# Patient Record
Sex: Female | Born: 1980 | Hispanic: Yes | Smoking: Never smoker
Health system: Southern US, Community
[De-identification: ages and names within clinical notes are randomized; demographics above are authoritative.]

## PROBLEM LIST (undated history)

## (undated) DIAGNOSIS — E079 Disorder of thyroid, unspecified: Secondary | ICD-10-CM

## (undated) DIAGNOSIS — E119 Type 2 diabetes mellitus without complications: Secondary | ICD-10-CM

## (undated) HISTORY — PX: TUBAL LIGATION: SHX77

## (undated) HISTORY — PX: CHOLECYSTECTOMY: SHX55

---

## 2021-03-28 ENCOUNTER — Emergency Department
Admission: EM | Admit: 2021-03-28 | Discharge: 2021-03-28 | Disposition: A | Discharge status: Home / Self Care | Payer: 59 | Source: Home / Self Care | Attending: Family Medicine | Admitting: Family Medicine

## 2021-03-28 ENCOUNTER — Other Ambulatory Visit: Payer: Self-pay

## 2021-03-28 ENCOUNTER — Encounter: Payer: Self-pay | Admitting: Emergency Medicine

## 2021-03-28 DIAGNOSIS — R319 Hematuria, unspecified: Secondary | ICD-10-CM

## 2021-03-28 DIAGNOSIS — N23 Unspecified renal colic: Secondary | ICD-10-CM | POA: Diagnosis not present

## 2021-03-28 DIAGNOSIS — N3001 Acute cystitis with hematuria: Secondary | ICD-10-CM

## 2021-03-28 DIAGNOSIS — R109 Unspecified abdominal pain: Secondary | ICD-10-CM

## 2021-03-28 HISTORY — DX: Disorder of thyroid, unspecified: E07.9

## 2021-03-28 LAB — POCT URINALYSIS DIP (MANUAL ENTRY)
Bilirubin, UA: NEGATIVE
Glucose, UA: NEGATIVE mg/dL
Ketones, POC UA: NEGATIVE mg/dL
Nitrite, UA: POSITIVE — AB
Protein Ur, POC: 100 mg/dL — AB
Spec Grav, UA: >=1.03 — AB (ref 1.010–1.025)
Urobilinogen, UA: 0.2 E.U./dL
pH, UA: 5.5 (ref 5.0–8.0)

## 2021-03-28 MED ORDER — KETOROLAC TROMETHAMINE 60 MG/2ML IM SOLN
60.0000 mg | Freq: Once | INTRAMUSCULAR | Status: AC
  Administered 2021-03-28: 60 mg via INTRAMUSCULAR

## 2021-03-28 MED ORDER — TAMSULOSIN HCL 0.4 MG PO CAPS: 0.4000 mg | ORAL_CAPSULE | Freq: Every day | ORAL | 1 refills | Status: DC

## 2021-03-28 MED ORDER — HYDROCODONE-ACETAMINOPHEN 7.5-325 MG PO TABS: 1.0000 | ORAL_TABLET | Freq: Four times a day (QID) | ORAL | 0 refills | Status: DC | PRN

## 2021-03-28 MED ORDER — CIPROFLOXACIN HCL 500 MG PO TABS: 500.0000 mg | ORAL_TABLET | Freq: Two times a day (BID) | ORAL | 0 refills | Status: DC

## 2021-03-28 NOTE — ED Provider Notes (Signed)
[name]    CSN: [id number] Arrival date & time: 03/28/21  1702      History   Chief Complaint Chief Complaint  Patient presents with   Flank Pain    Left     HPI Christine Carney is a 40 y.o. female.   HPI  Patient is generally in good health.  She woke up this morning with severe left flank pain.  No nausea or vomiting.  No fever or chills.  No dysuria or frequency.  She has never had a kidney stone.  Never had a kidney infection.  She has had bladder infections before.  She does not think she has a bladder infection at this time.  No vaginal discharge.  The left flank pain does radiate towards her low back and around towards the lower left abdomen.  She has not noticed any cloudy urine, or blood in her urine  Past Medical History:  Diagnosis Date   Thyroid disease     There are no problems to display for this patient.   Past Surgical History:  Procedure Laterality Date   CHOLECYSTECTOMY     TUBAL LIGATION Bilateral     OB History   No obstetric history on file.      Home Medications    Prior to Admission medications   Medication Sig Start Date End Date Taking? Authorizing Provider  Bacillus Coagulans-Inulin (PROBIOTIC) 1-250 BILLION-MG CAPS Take by mouth daily at 2 PM.   Yes [provider]  ciprofloxacin (CIPRO) 500 MG tablet Take 1 tablet (500 mg total) by mouth 2 (two) times daily. 03/28/21  Yes Eustace Moore, MD  HYDROcodone-acetaminophen [facility]) 7.5-325 MG tablet Take 1 tablet by mouth every 6 (six) hours as needed for moderate pain. 03/28/21  Yes Eustace Moore, MD  metFORMIN (GLUMETZA) 1000 MG (MOD) 24 hr tablet Take 500 mg by mouth daily with breakfast.   Yes [provider]  Multiple Vitamin (MULTIVITAMIN) tablet Take 1 tablet by mouth daily.   Yes [provider]  tamsulosin (FLOMAX) 0.4 MG CAPS capsule Take 1 capsule (0.4 mg total) by mouth daily after supper. 03/28/21  Yes Eustace Moore, MD   EUTHYROX 100 MCG tablet Take 100 mcg by mouth daily. 02/02/21   [provider]    Family History Family History  Problem Relation Age of Onset   Hypertension Mother    Hyperlipidemia Mother    Hyperlipidemia Father    Diabetes Father     Social History Social History   Tobacco Use   Smoking status: Never    Passive exposure: Never   Smokeless tobacco: Never  Vaping Use   Vaping Use: Never used  Substance Use Topics   Alcohol use: Not Currently   Drug use: Never     Allergies   Patient has no known allergies.   Review of Systems Review of Systems See HPI  Physical Exam Triage Vital Signs ED Triage Vitals  Enc Vitals Group     BP 03/28/21 1738 123/85     Pulse Rate 03/28/21 1738 91     Resp 03/28/21 1738 18     Temp 03/28/21 1738 99.1 F (37.3 C)     Temp Source 03/28/21 1738 Oral     SpO2 03/28/21 1738 98 %     Weight 03/28/21 1743 190 lb (86.2 kg)     Height 03/28/21 1743 5\' 3"  (1.6 m)     Head Circumference --      Peak  Flow --      Pain Score 03/28/21 1737 7     Pain Loc --      Pain Edu? --      Excl. in GC? --    No data found.  Updated Vital Signs BP 123/85 (BP Location: Right Arm)   Pulse 91   Temp 99.1 F (37.3 C) (Oral)   Resp 18   Ht 5\' 3"  (1.6 m)   Wt 86.2 kg   LMP 03/21/2021 (Exact Date)   SpO2 98%   BMI 33.66 kg/m     Physical Exam Constitutional:      General: She is in acute distress.     Appearance: She is well-developed.     Comments: Acutely uncomfortable.  As I come in she is standing bent at the waist with her head on the exam table.  HENT:     Head: Normocephalic and atraumatic.     Mouth/Throat:     Comments: Mask is in place Eyes:     Conjunctiva/sclera: Conjunctivae normal.     Pupils: Pupils are equal, round, and reactive to light.  Cardiovascular:     Rate and Rhythm: Normal rate.  Pulmonary:     Effort: Pulmonary effort is normal. No respiratory distress.  Abdominal:     General: There is no  distension.     Palpations: Abdomen is soft.     Tenderness: There is left CVA tenderness.  Musculoskeletal:        General: Normal range of motion.     Cervical back: Normal range of motion.  Skin:    General: Skin is warm and dry.  Neurological:     Mental Status: She is alert.  Psychiatric:        Mood and Affect: Mood normal.        Behavior: Behavior normal.     UC Treatments / Results  Labs (all labs ordered are listed, but only abnormal results are displayed) Labs Reviewed  POCT URINALYSIS DIP (MANUAL ENTRY) - Abnormal; Notable for the following components:      Result Value   Clarity, UA cloudy (*)    Spec Grav, UA >=1.030 (*)    Blood, UA moderate (*)    Protein Ur, POC =100 (*)    Nitrite, UA Positive (*)    Leukocytes, UA Small (1+) (*)    All other components within normal limits  URINE CULTURE    EKG   Radiology No results found.  Procedures Procedures (including critical care time)  Medications Ordered in UC Medications  ketorolac (TORADOL) injection 60 mg (60 mg Intramuscular Given 03/28/21 1810)    Initial Impression / Assessment and Plan / UC Course  I have reviewed the triage vital signs and the nursing notes.  Pertinent labs & imaging results that were available during my care of the patient were reviewed by me and considered in my medical decision making (see chart for details).     Patient does not have any symptoms consistent with pyelonephritis such as nausea and vomiting or fever and chills.  She does have nitrites in her urine.  She has no symptoms of cystitis.  I think she likely has a kidney stone and asymptomatic bacteriuria.  We will send her urine for culture.  We will treat for kidney stone.  Follow-up if she fails to improve Final Clinical Impressions(s) / UC Diagnoses   Final diagnoses:  Acute cystitis with hematuria  Left flank pain  Hematuria, unspecified type  Ureteral colic     Discharge Instructions      Take the  antibiotic 2 times a day for a week Drink lots of water I am prescribing hydrocodone to take as needed for pain.  Do not drive on hydrocodone Tamsulosin as prescribed to help pass the kidney stone.  Take it until you feel well Strain your urine each time you go.  If you are not able to catch a stone, you will take this to your primary care doctor for analysis  If you get worse instead of better at any time you must go to the emergency room   ED Prescriptions     Medication Sig Dispense Auth. Provider   ciprofloxacin (CIPRO) 500 MG tablet Take 1 tablet (500 mg total) by mouth 2 (two) times daily. 14 tablet Eustace Moore, MD   HYDROcodone-acetaminophen [facility]) 7.5-325 MG tablet Take 1 tablet by mouth every 6 (six) hours as needed for moderate pain. 15 tablet Eustace Moore, MD   tamsulosin (FLOMAX) 0.4 MG CAPS capsule Take 1 capsule (0.4 mg total) by mouth daily after supper. 30 capsule Eustace Moore, MD      I have reviewed the PDMP during this encounter.   Eustace Moore, MD 03/28/21 [id number]

## 2021-03-28 NOTE — Discharge Instructions (Signed)
Take the antibiotic 2 times a day for a week Drink lots of water I am prescribing hydrocodone to take as needed for pain.  Do not drive on hydrocodone Tamsulosin as prescribed to help pass the kidney stone.  Take it until you feel well Strain your urine each time you go.  If you are not able to catch a stone, you will take this to your primary care doctor for analysis  If you get worse instead of better at any time you must go to the emergency room

## 2021-03-28 NOTE — ED Triage Notes (Signed)
Left flank pain woke pt up today at 1630 today  Pain radiates around to left lower abd No history of kidney stones  Denies history of ovarian cysts Denies fever

## 2021-03-30 LAB — URINE CULTURE
MICRO NUMBER:: 12252969
SPECIMEN QUALITY:: ADEQUATE

## 2021-07-21 ENCOUNTER — Emergency Department (INDEPENDENT_AMBULATORY_CARE_PROVIDER_SITE_OTHER): Payer: Self-pay

## 2021-07-21 ENCOUNTER — Other Ambulatory Visit: Payer: Self-pay

## 2021-07-21 ENCOUNTER — Emergency Department (INDEPENDENT_AMBULATORY_CARE_PROVIDER_SITE_OTHER)
Admission: RE | Admit: 2021-07-21 | Discharge: 2021-07-21 | Disposition: A | Discharge status: Home / Self Care | Payer: Self-pay | Source: Ambulatory Visit

## 2021-07-21 VITALS — BP 103/62 | HR 88 | Temp 98.7°F | Resp 18

## 2021-07-21 DIAGNOSIS — M25562 Pain in left knee: Secondary | ICD-10-CM

## 2021-07-21 DIAGNOSIS — M7652 Patellar tendinitis, left knee: Secondary | ICD-10-CM

## 2021-07-21 MED ORDER — DICLOFENAC SODIUM 75 MG PO TBEC: DELAYED_RELEASE_TABLET | ORAL | 0 refills | Status: DC

## 2021-07-21 NOTE — ED Provider Notes (Signed)
[facility]    CSN: [id number] Arrival date & time: 07/21/21  0919      History   Chief Complaint Chief Complaint  Patient presents with   Knee Pain    LT    HPI  HPI Christine Carney is a 40 y.o. female who complains of left knee pain since Tuesday.  She reports prior to Tuesday she had no issues with her knees.  She denies any injury.  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  She states it popped 1 time and made it feel better, but still continues to hurt.  She tried over-the-counter Tylenol once which did not help.  States it swells intermittently, but is not worsening.  She reports the pain does extend down to her lower leg and feels the swelling is also in her left foot.  She does not smoke and is not on birth control.  She has not had prolonged sedentary work and has not had any recent surgical procedures..    Knee Pain  Past Medical History:  Diagnosis Date   Thyroid disease     There are no problems to display for this patient.   Past Surgical History:  Procedure Laterality Date   CHOLECYSTECTOMY     TUBAL LIGATION Bilateral     OB History   No obstetric history on file.      Home Medications    Prior to Admission medications   Medication Sig Start Date End Date Taking? Authorizing Provider  diclofenac (VOLTAREN) 75 MG EC tablet One tab PO BID with food x 7 days, then PRN 07/21/21  Yes Egan Sahlin L, PA  Bacillus Coagulans-Inulin (PROBIOTIC) 1-250 BILLION-MG CAPS Take by mouth daily at 2 PM.    [provider]  ciprofloxacin (CIPRO) 500 MG tablet Take 1 tablet (500 mg total) by mouth 2 (two) times daily. 03/28/21   Eustace Moore, MD  EUTHYROX 100 MCG tablet Take 100 mcg by mouth daily. 02/02/21   [provider]  HYDROcodone-acetaminophen (NORCO) 7.5-325 MG tablet Take 1 tablet by mouth every 6 (six) hours as  needed for moderate pain. 03/28/21   Eustace Moore, MD  metFORMIN (GLUMETZA) 1000 MG (MOD) 24 hr tablet Take 500 mg by mouth daily with breakfast.    [provider]  Multiple Vitamin (MULTIVITAMIN) tablet Take 1 tablet by mouth daily.    [provider]  tamsulosin (FLOMAX) 0.4 MG CAPS capsule Take 1 capsule (0.4 mg total) by mouth daily after supper. 03/28/21   Eustace Moore, MD    Family History Family History  Problem Relation Age of Onset   Hypertension Mother    Hyperlipidemia Mother    Hyperlipidemia Father    Diabetes Father     Social History Social History   Tobacco Use   Smoking status: Never    Passive exposure: Never   Smokeless tobacco: Never  Vaping Use   Vaping Use: Never used  Substance Use Topics   Alcohol use: Not Currently   Drug use: Never     Allergies   Patient has no known allergies.   Review of Systems Review of Systems As noted in hpi  Physical Exam Triage Vital Signs ED Triage Vitals  Enc Vitals Group     BP 07/21/21 0956 103/62     Pulse Rate 07/21/21 0956 88  Resp 07/21/21 0956 18     Temp 07/21/21 0956 98.7 F (37.1 C)     Temp Source 07/21/21 0956 Oral     SpO2 07/21/21 0956 99 %     Weight --      Height --      Head Circumference --      Peak Flow --      Pain Score 07/21/21 0959 7     Pain Loc --      Pain Edu? --      Excl. in GC? --    No data found.  Updated Vital Signs BP 103/62 (BP Location: Right Arm)   Pulse 88   Temp 98.7 F (37.1 C) (Oral)   Resp 18   LMP  (LMP Unknown)   SpO2 99%   Visual Acuity Right Eye Distance:   Left Eye Distance:   Bilateral Distance:    Right Eye Near:   Left Eye Near:    Bilateral Near:     Physical Exam Vitals reviewed.  Constitutional:      General: She is not in acute distress.    Appearance: Normal appearance. She is normal weight.  HENT:     Head: Normocephalic.  Musculoskeletal:        General: Tenderness present. No swelling,  deformity or signs of injury. Normal range of motion.     Right lower leg: Edema present.     Left lower leg: Edema present.     Comments: Mild edema to bilateral lower extremities, R=L. No erythema. NEGATIVE homan sign NEGATIVE McMurray, Apley compression, varus and valgus stress, negative Lachman negative posterior and anterior drawer.  Skin:    General: Skin is warm.     Capillary Refill: Capillary refill takes less than 2 seconds.     Findings: No erythema.  Neurological:     General: No focal deficit present.     Mental Status: She is alert and oriented to person, place, and time. Mental status is at baseline.     Sensory: No sensory deficit.     Motor: No weakness.     Coordination: Coordination normal.     Gait: Gait abnormal (favoring left leg).  Psychiatric:        Mood and Affect: Mood normal.     UC Treatments / Results  Labs (all labs ordered are listed, but only abnormal results are displayed) Labs Reviewed - No data to display  EKG   Radiology DG Knee Complete 4 Views Left  Result Date: 07/21/2021 CLINICAL DATA:  Sudden onset LEFT knee pain EXAM: LEFT KNEE - COMPLETE 4+ VIEW COMPARISON:  None. FINDINGS: No fracture of the proximal tibia or distal femur. Patella is normal. No joint effusion. IMPRESSION: Normal knee radiograph Electronically Signed   By: Genevive Bi M.D.   On: 07/21/2021 10:56    Procedures Procedures (including critical care time)  Medications Ordered in UC Medications - No data to display  Initial Impression / Assessment and Plan / UC Course  I have reviewed the triage vital signs and the nursing notes.  Pertinent labs & imaging results that were available during my care of the patient were reviewed by me and considered in my medical decision making (see chart for details).  Clinical Course as of 07/21/21 1913  Fri Jul 21, 2021  1004 DG Knee Complete 4 Views Left [WC]  1055 DG Knee Complete 4 Views Left [WC]    Clinical Course User  Index [WC] Guy Sandifer L,  PA    Left inferior patellar tendonitis - patellar tendon strap, NSAIDs, ROM exercises. Limit stairs/ up&down activities for 1-2 weeks. Final Clinical Impressions(s) / UC Diagnoses   Final diagnoses:  Jumper's knee of left side     Discharge Instructions      Take diclofenac twice daily with food x1 week, then as needed. Do home physical therapy regimens as discussed Purchase over-the-counter patellar tendon strap and wear daily until symptoms resolve X-ray in office was normal Follow-up in clinic for any new or worsening symptoms. Warning signs that would warrant ER would be worsening of edema, lower extremity pain or erythema.     ED Prescriptions     Medication Sig Dispense Auth. Provider   diclofenac (VOLTAREN) 75 MG EC tablet One tab PO BID with food x 7 days, then PRN 30 tablet Barbara Keng L, PA      PDMP not reviewed this encounter.   Maretta Bees, [place] 07/21/21 1922

## 2021-07-21 NOTE — Discharge Instructions (Addendum)
Take diclofenac twice daily with food x1 week, then as needed. Do home physical therapy regimens as discussed Purchase over-the-counter patellar tendon strap and wear daily until symptoms resolve X-ray in office was normal Follow-up in clinic for any new or worsening symptoms. Warning signs that would warrant ER would be worsening of edema, lower extremity pain or erythema.

## 2021-07-21 NOTE — ED Triage Notes (Signed)
Pt c/o LT knee pain since Tuesday. Says she got it to pop and thought that would help, but it continues to hurt. Sometimes it radiates to sole of foot. Denies injury. No previous hx of knee injuries. Pain 7/10

## 2021-09-26 ENCOUNTER — Emergency Department
Admission: RE | Admit: 2021-09-26 | Discharge: 2021-09-26 | Disposition: A | Discharge status: Home / Self Care | Payer: Self-pay | Source: Ambulatory Visit | Attending: Family Medicine | Admitting: Family Medicine

## 2021-09-26 ENCOUNTER — Other Ambulatory Visit: Payer: Self-pay

## 2021-09-26 VITALS — BP 125/82 | HR 85 | Temp 98.8°F | Resp 18 | Ht 63.0 in | Wt 190.0 lb

## 2021-09-26 DIAGNOSIS — J209 Acute bronchitis, unspecified: Secondary | ICD-10-CM

## 2021-09-26 MED ORDER — BENZONATATE 200 MG PO CAPS: 200.0000 mg | ORAL_CAPSULE | Freq: Three times a day (TID) | ORAL | 0 refills | Status: DC | PRN

## 2021-09-26 MED ORDER — AZITHROMYCIN 250 MG PO TABS: ORAL_TABLET | ORAL | 0 refills | Status: DC

## 2021-09-26 MED ORDER — PREDNISONE 20 MG PO TABS: 40.0000 mg | ORAL_TABLET | Freq: Every day | ORAL | 0 refills | Status: DC

## 2021-09-26 NOTE — ED Provider Notes (Signed)
[name]    CSN: [id number] Arrival date & time: 09/26/21  1637      History   Chief Complaint Chief Complaint  Patient presents with   Cough    HPI Christine Carney is a 41 y.o. female.   HPI Patient's been sick for just over a week.  She has runny nose and sinus congestion.  Postnasal drip.  Cough.  Shortness of breath.  Dyspnea on exertion.  She has had some headache and body aches.  The headache was the first symptoms, and it appears to have improved.  She is developed ear pressure the last couple of days.  Hearing is normal.  She has taken over-the-counter medicines.  They give her some improvement.  She is concerned because she has had bronchitis in the past, she feels like this infection has settled in her chest and she is becoming more short of breath  Past Medical History:  Diagnosis Date   Thyroid disease     There are no problems to display for this patient.   Past Surgical History:  Procedure Laterality Date   CHOLECYSTECTOMY     TUBAL LIGATION Bilateral     OB History   No obstetric history on file.      Home Medications    Prior to Admission medications   Medication Sig Start Date End Date Taking? Authorizing Provider  azithromycin (ZITHROMAX Z-PAK) 250 MG tablet Take 2 pills today.  Starting tomorrow take 1 pill a day until gone 09/26/21  Yes Eustace Moore, MD  benzonatate (TESSALON) 200 MG capsule Take 1 capsule (200 mg total) by mouth 3 (three) times daily as needed for cough. 09/26/21  Yes Eustace Moore, MD  predniSONE (DELTASONE) 20 MG tablet Take 2 tablets (40 mg total) by mouth daily with breakfast. 09/26/21  Yes Eustace Moore, MD  Bacillus Coagulans-Inulin (PROBIOTIC) 1-250 BILLION-MG CAPS Take by mouth daily at 2 PM.    [provider]  EUTHYROX 100 MCG tablet Take 100 mcg by mouth daily. 02/02/21   [provider]  metFORMIN (GLUMETZA) 1000 MG (MOD) 24 hr tablet Take 500 mg by mouth daily with  breakfast.    [provider]  Multiple Vitamin (MULTIVITAMIN) tablet Take 1 tablet by mouth daily.    [provider]    Family History Family History  Problem Relation Age of Onset   Hypertension Mother    Hyperlipidemia Mother    Hyperlipidemia Father    Diabetes Father     Social History Social History   Tobacco Use   Smoking status: Never    Passive exposure: Never   Smokeless tobacco: Never  Vaping Use   Vaping Use: Never used  Substance Use Topics   Alcohol use: Not Currently   Drug use: Never     Allergies   Patient has no known allergies.   Review of Systems Review of Systems See HPI  Physical Exam Triage Vital Signs ED Triage Vitals  Enc Vitals Group     BP 09/26/21 1652 125/82     Pulse Rate 09/26/21 1652 85     Resp 09/26/21 1652 18     Temp 09/26/21 1652 98.8 F (37.1 C)     Temp Source 09/26/21 1652 Oral     SpO2 09/26/21 1652 99 %     Weight 09/26/21 1653 190 lb (86.2 kg)     Height 09/26/21 1653 5\' 3"  (1.6 m)     Head Circumference --  Peak Flow --      Pain Score 09/26/21 1653 0     Pain Loc --      Pain Edu? --      Excl. in GC? --    No data found.  Updated Vital Signs BP 125/82 (BP Location: Left Arm)    Pulse 85    Temp 98.8 F (37.1 C) (Oral)    Resp 18    Ht 5\' 3"  (1.6 m)    Wt 86.2 kg    SpO2 99%    BMI 33.66 kg/m      Physical Exam Constitutional:      General: She is not in acute distress.    Appearance: She is well-developed.  HENT:     Head: Normocephalic and atraumatic.     Right Ear: Tympanic membrane and ear canal normal.     Left Ear: Tympanic membrane and ear canal normal.     Nose: Congestion present.     Mouth/Throat:     Pharynx: Posterior oropharyngeal erythema present.  Eyes:     Conjunctiva/sclera: Conjunctivae normal.     Pupils: Pupils are equal, round, and reactive to light.  Neck:     Vascular: No carotid bruit.  Cardiovascular:     Rate and Rhythm: Normal rate and regular  rhythm.     Heart sounds: Normal heart sounds.  Pulmonary:     Effort: Pulmonary effort is normal. No respiratory distress.     Breath sounds: No wheezing or rhonchi.     Comments: Very few scattered wheeze.  Anterior rhonchi Abdominal:     General: There is no distension.     Palpations: Abdomen is soft.  Musculoskeletal:        General: Normal range of motion.     Cervical back: Normal range of motion.  Lymphadenopathy:     Cervical: Cervical adenopathy present.  Skin:    General: Skin is warm and dry.  Neurological:     Mental Status: She is alert.  Psychiatric:        Mood and Affect: Mood normal.        Behavior: Behavior normal.     UC Treatments / Results  Labs (all labs ordered are listed, but only abnormal results are displayed) Labs Reviewed - No data to display  EKG   Radiology No results found.  Procedures Procedures (including critical care time)  Medications Ordered in UC Medications - No data to display  Initial Impression / Assessment and Plan / UC Course  I have reviewed the triage vital signs and the nursing notes.  Pertinent labs & imaging results that were available during my care of the patient were reviewed by me and considered in my medical decision making (see chart for details).     Final Clinical Impressions(s) / UC Diagnoses   Final diagnoses:  Acute bronchitis, unspecified organism     Discharge Instructions      Make sure you are drinking lots of water Run a humidifier in your bedroom if you have 1 Take the azithromycin antibiotic as prescribed.  Take 2 pills today, then 1 a day until gone Take prednisone 1 dose a day for 5 days.  Take your first dose today Take cough medicine as needed Call your doctor if not improving by next week   ED Prescriptions     Medication Sig Dispense Auth. Provider   azithromycin (ZITHROMAX Z-PAK) 250 MG tablet Take 2 pills today.  Starting tomorrow take 1 pill  a day until gone 6 tablet  Eustace Moore, MD   predniSONE (DELTASONE) 20 MG tablet Take 2 tablets (40 mg total) by mouth daily with breakfast. 10 tablet Eustace Moore, MD   benzonatate (TESSALON) 200 MG capsule Take 1 capsule (200 mg total) by mouth 3 (three) times daily as needed for cough. 21 capsule Eustace Moore, MD      PDMP not reviewed this encounter.   Eustace Moore, MD 09/26/21 [phone]

## 2021-09-26 NOTE — ED Triage Notes (Signed)
Dry cough, ear pressure , headache x 1 week Unvaccinated No Flu Shot

## 2021-09-26 NOTE — Discharge Instructions (Signed)
Make sure you are drinking lots of water Run a humidifier in your bedroom if you have 1 Take the azithromycin antibiotic as prescribed.  Take 2 pills today, then 1 a day until gone Take prednisone 1 dose a day for 5 days.  Take your first dose today Take cough medicine as needed Call your doctor if not improving by next week

## 2022-06-22 ENCOUNTER — Ambulatory Visit: Payer: Self-pay

## 2022-06-25 ENCOUNTER — Ambulatory Visit
Admission: RE | Admit: 2022-06-25 | Discharge: 2022-06-25 | Disposition: A | Discharge status: Home / Self Care | Payer: Self-pay | Source: Ambulatory Visit | Attending: Family Medicine | Admitting: Family Medicine

## 2022-06-25 VITALS — BP 132/89 | HR 72 | Temp 98.6°F | Resp 12

## 2022-06-25 DIAGNOSIS — R059 Cough, unspecified: Secondary | ICD-10-CM

## 2022-06-25 HISTORY — DX: Type 2 diabetes mellitus without complications: E11.9

## 2022-06-25 MED ORDER — BENZONATATE 200 MG PO CAPS: 200.0000 mg | ORAL_CAPSULE | Freq: Three times a day (TID) | ORAL | 0 refills | Status: AC | PRN

## 2022-06-25 MED ORDER — PREDNISONE 50 MG PO TABS: ORAL_TABLET | ORAL | 0 refills | Status: DC

## 2022-06-25 MED ORDER — AZITHROMYCIN 250 MG PO TABS: 250.0000 mg | ORAL_TABLET | Freq: Every day | ORAL | 0 refills | Status: DC

## 2022-06-25 NOTE — ED Triage Notes (Signed)
Pt presents with c/o cough x 2 weeks 

## 2022-06-25 NOTE — Discharge Instructions (Addendum)
Advised patient to take medications as directed with food to completion.  Instructed patient to take prednisone with Zithromax daily for the next 5 days.  Advised patient may take Tessalon daily or as needed for cough encouraged patient increase daily water intake to 64 ounces per day while taking these medications.  Advised if symptoms worsen and/or unresolved please follow-up with PCP or here for further evaluation.

## 2022-06-25 NOTE — ED Provider Notes (Signed)
[facility]    CSN: [id number] Arrival date & time: 06/25/22  1441      History   Chief Complaint Chief Complaint  Patient presents with   Cough    Have been coughing for a week. Have coughing spells and have hard time to breathe. - Entered by patient    HPI Christine Carney is a 41 y.o. female.   HPI Pleasant 41 year old female presents with cough for 2 weeks.  PMH significant for T2DM without complication and  thyroid disease.  Past Medical History:  Diagnosis Date   Diabetes mellitus without complication (HCC)    Thyroid disease     There are no problems to display for this patient.   Past Surgical History:  Procedure Laterality Date   CHOLECYSTECTOMY     TUBAL LIGATION Bilateral     OB History   No obstetric history on file.      Home Medications    Prior to Admission medications   Medication Sig Start Date End Date Taking? Authorizing Provider  azithromycin (ZITHROMAX) 250 MG tablet Take 1 tablet (250 mg total) by mouth daily. Take first 2 tablets together, then 1 every day until finished. 06/25/22  Yes Trevor Iha, FNP  benzonatate (TESSALON) 200 MG capsule Take 1 capsule (200 mg total) by mouth 3 (three) times daily as needed for up to 7 days. 06/25/22 07/02/22 Yes Trevor Iha, FNP  predniSONE (DELTASONE) 50 MG tablet Take 1 tab p.o. daily for 5 days. 06/25/22  Yes Trevor Iha, FNP  Bacillus Coagulans-Inulin (PROBIOTIC) 1-250 BILLION-MG CAPS Take by mouth daily at 2 PM.    [provider]  EUTHYROX 100 MCG tablet Take 100 mcg by mouth daily. 02/02/21   [provider]  Multiple Vitamin (MULTIVITAMIN) tablet Take 1 tablet by mouth daily.    [provider]    Family History Family History  Problem Relation Age of Onset   Hypertension Mother    Hyperlipidemia Mother    Hyperlipidemia Father    Diabetes Father     Social History Social History   Tobacco Use   Smoking status: Never    Passive  exposure: Never   Smokeless tobacco: Never  Vaping Use   Vaping Use: Never used  Substance Use Topics   Alcohol use: Not Currently   Drug use: Never     Allergies   Patient has no known allergies.   Review of Systems Review of Systems  Respiratory:  Positive for cough.   All other systems reviewed and are negative.    Physical Exam Triage Vital Signs ED Triage Vitals  Enc Vitals Group     BP 06/25/22 1450 132/89     Pulse Rate 06/25/22 1450 72     Resp 06/25/22 1450 12     Temp 06/25/22 1450 98.6 F (37 C)     Temp Source 06/25/22 1450 Oral     SpO2 06/25/22 1450 100 %     Weight --      Height --      Head Circumference --      Peak Flow --      Pain Score 06/25/22 1448 0     Pain Loc --      Pain Edu? --      Excl. in GC? --    No data found.  Updated Vital Signs BP 132/89 (BP Location: Right Arm)   Pulse 72   Temp 98.6 F (37 C) (Oral)   Resp 12  SpO2 100%      Physical Exam Vitals and nursing note reviewed.  Constitutional:      Appearance: Normal appearance. She is obese.  HENT:     Head: Normocephalic and atraumatic.     Right Ear: Tympanic membrane, ear canal and external ear normal.     Left Ear: Tympanic membrane, ear canal and external ear normal.     Mouth/Throat:     Mouth: Mucous membranes are moist.     Pharynx: Oropharynx is clear.  Eyes:     Extraocular Movements: Extraocular movements intact.     Conjunctiva/sclera: Conjunctivae normal.     Pupils: Pupils are equal, round, and reactive to light.  Cardiovascular:     Rate and Rhythm: Normal rate and regular rhythm.     Pulses: Normal pulses.     Heart sounds: Normal heart sounds.  Pulmonary:     Effort: Pulmonary effort is normal. No respiratory distress.     Breath sounds: Normal breath sounds. No wheezing, rhonchi or rales.     Comments: Infrequent nonproductive cough noted on exam Chest:     Chest wall: No tenderness.  Musculoskeletal:        General: Normal range of  motion.     Cervical back: Normal range of motion and neck supple.  Skin:    General: Skin is warm and dry.  Neurological:     General: No focal deficit present.     Mental Status: She is alert and oriented to person, place, and time. Mental status is at baseline.      UC Treatments / Results  Labs (all labs ordered are listed, but only abnormal results are displayed) Labs Reviewed - No data to display  EKG   Radiology No results found.  Procedures Procedures (including critical care time)  Medications Ordered in UC Medications - No data to display  Initial Impression / Assessment and Plan / UC Course  I have reviewed the triage vital signs and the nursing notes.  Pertinent labs & imaging results that were available during my care of the patient were reviewed by me and considered in my medical decision making (see chart for details).     MDM: 1.  Cough-Rx'd Zithromax, prednisone, Tessalon. Advised patient to take medications as directed with food to completion.  Instructed patient to take prednisone with Zithromax daily for the next 5 days.  Advised patient may take Tessalon daily or as needed for cough encouraged patient increase daily water intake to 64 ounces per day while taking these medications.  Advised if symptoms worsen and/or unresolved please follow-up with PCP or here for further evaluation.  Discharged home, hemodynamically stable. Final Clinical Impressions(s) / UC Diagnoses   Final diagnoses:  Cough, unspecified type     Discharge Instructions      Advised patient to take medications as directed with food to completion.  Instructed patient to take prednisone with Zithromax daily for the next 5 days.  Advised patient may take Tessalon daily or as needed for cough encouraged patient increase daily water intake to 64 ounces per day while taking these medications.  Advised if symptoms worsen and/or unresolved please follow-up with PCP or here for further  evaluation.     ED Prescriptions     Medication Sig Dispense Auth. Provider   azithromycin (ZITHROMAX) 250 MG tablet Take 1 tablet (250 mg total) by mouth daily. Take first 2 tablets together, then 1 every day until finished. 6 tablet Trevor Iha, FNP  predniSONE (DELTASONE) 50 MG tablet Take 1 tab p.o. daily for 5 days. 5 tablet Trevor Iha, FNP   benzonatate (TESSALON) 200 MG capsule Take 1 capsule (200 mg total) by mouth 3 (three) times daily as needed for up to 7 days. 40 capsule Trevor Iha, FNP      PDMP not reviewed this encounter.   Trevor Iha, FNP 06/25/22 1513

## 2023-07-25 ENCOUNTER — Ambulatory Visit
Admission: RE | Admit: 2023-07-25 | Discharge: 2023-07-25 | Disposition: A | Discharge status: Home / Self Care | Payer: 59 | Source: Ambulatory Visit | Attending: Family Medicine | Admitting: Family Medicine

## 2023-07-25 ENCOUNTER — Ambulatory Visit: Payer: 59

## 2023-07-25 ENCOUNTER — Other Ambulatory Visit: Payer: Self-pay

## 2023-07-25 ENCOUNTER — Telehealth: Payer: Self-pay | Admitting: Family Medicine

## 2023-07-25 VITALS — BP 140/89 | HR 74 | Temp 98.5°F | Resp 16

## 2023-07-25 DIAGNOSIS — M5412 Radiculopathy, cervical region: Secondary | ICD-10-CM

## 2023-07-25 DIAGNOSIS — M79601 Pain in right arm: Secondary | ICD-10-CM | POA: Diagnosis not present

## 2023-07-25 DIAGNOSIS — R202 Paresthesia of skin: Secondary | ICD-10-CM | POA: Diagnosis not present

## 2023-07-25 DIAGNOSIS — R200 Anesthesia of skin: Secondary | ICD-10-CM | POA: Diagnosis not present

## 2023-07-25 MED ORDER — METHYLPREDNISOLONE 4 MG PO TBPK: ORAL_TABLET | ORAL | 0 refills | Status: AC

## 2023-07-25 MED ORDER — CYCLOBENZAPRINE HCL 10 MG PO TABS: 10.0000 mg | ORAL_TABLET | Freq: Every day | ORAL | 0 refills | Status: AC

## 2023-07-25 NOTE — ED Provider Notes (Signed)
[facility]    CSN: [id number] Arrival date & time: 07/25/23  1444      History   Chief Complaint Chief Complaint  Patient presents with   Wrist Pain    HPI Christine Carney is a 42 y.o. female.   HPI  Patient is here for right arm pain and tenderness.  She did not have a or injury.  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  She states this is normal for her.  She does have pain that goes from her base of her neck into her shoulder.  Also numbness that goes from her shoulder all the way into all 5 fingers.  She states that she woke up with it 1 morning.  Does not recall any change of activity, overuse, or trauma.  Does not have any neck problems that she knows of.  No history of disc disease or pinched nerve.  Past Medical History:  Diagnosis Date   Diabetes mellitus without complication (HCC)    Thyroid disease     There are no active problems to display for this patient.   Past Surgical History:  Procedure Laterality Date   CHOLECYSTECTOMY     TUBAL LIGATION Bilateral     OB History   No obstetric history on file.      Home Medications    Prior to Admission medications   Medication Sig Start Date End Date Taking? Authorizing Provider  cyclobenzaprine (FLEXERIL) 10 MG tablet Take 1 tablet (10 mg total) by mouth at bedtime. 07/25/23  Yes Eustace Moore, MD  metformin (FORTAMET) 1000 MG (OSM) 24 hr tablet Take 1,000 mg by mouth daily with breakfast.   Yes [provider]  methylPREDNISolone (MEDROL DOSEPAK) 4 MG TBPK tablet tad 07/25/23  Yes Eustace Moore, MD  sertraline (ZOLOFT) 25 MG tablet Take 25 mg by mouth daily.   Yes [provider]  Multiple Vitamin (MULTIVITAMIN) tablet Take 1 tablet by mouth daily.    [provider]    Family History Family History  Problem Relation Age of Onset   Hypertension Mother    Hyperlipidemia Mother    Hyperlipidemia  Father    Diabetes Father     Social History Social History   Tobacco Use   Smoking status: Never    Passive exposure: Never   Smokeless tobacco: Never  Vaping Use   Vaping status: Never Used  Substance Use Topics   Alcohol use: Not Currently   Drug use: Never     Allergies   Patient has no known allergies.   Review of Systems Review of Systems See HPI  Physical Exam Triage Vital Signs ED Triage Vitals  Encounter Vitals Group     BP 07/25/23 1447 (!) 140/89     Systolic BP Percentile --      Diastolic BP Percentile --      Pulse Rate 07/25/23 1447 74     Resp 07/25/23 1447 16     Temp 07/25/23 1447 98.5 F (36.9 C)     Temp src --      SpO2 07/25/23 1447 99 %     Weight --      Height --      Head Circumference --      Peak Flow --      Pain Score 07/25/23 1449 6     Pain Loc --      Pain Education --  Exclude from Growth Chart --    No data found.  Updated Vital Signs BP (!) 140/89   Pulse 74   Temp 98.5 F (36.9 C)   Resp 16   SpO2 99%      Physical Exam Constitutional:      General: She is not in acute distress.    Appearance: She is well-developed. She is not ill-appearing.  HENT:     Head: Normocephalic and atraumatic.  Eyes:     Conjunctiva/sclera: Conjunctivae normal.     Pupils: Pupils are equal, round, and reactive to light.  Neck:     Comments: There is tenderness in the right upper body of the trapezius on the right paraspinous muscles especially at the C5-7 region.  Increased muscle tone.  Slow but full range of motion Cardiovascular:     Rate and Rhythm: Normal rate.  Pulmonary:     Effort: Pulmonary effort is normal. No respiratory distress.  Abdominal:     General: There is no distension.     Palpations: Abdomen is soft.  Musculoskeletal:        General: Normal range of motion.     Cervical back: Normal range of motion.     Comments: Both upper extremities have normal strength sensation range of motion and reflexes.   There are no focal neurologic findings  Skin:    General: Skin is warm and dry.  Neurological:     General: No focal deficit present.     Mental Status: She is alert.     Sensory: No sensory deficit.     Motor: No weakness.     Coordination: Coordination normal.     Deep Tendon Reflexes: Reflexes normal.      UC Treatments / Results  Labs (all labs ordered are listed, but only abnormal results are displayed) Labs Reviewed - No data to display  EKG   Radiology DG Cervical Spine Complete Result Date: 07/25/2023 CLINICAL DATA:  right arm pain and nubmness EXAM: CERVICAL SPINE - COMPLETE 4+ VIEW COMPARISON:  None Available. FINDINGS: There is no evidence of cervical spine fracture or prevertebral soft tissue swelling. Straightening of the cervical lordosis. No static listhesis. Intervertebral disc heights are preserved. Mild endplate spurring at C5-6. Oblique views demonstrate patent bony foramina bilaterally. No other significant bone abnormalities are identified. IMPRESSION: Mild degenerative changes at C5-6. No acute findings. Electronically Signed   By: Duanne Guess D.O.   On: 07/25/2023 16:23    Procedures Procedures (including critical care time)  Medications Ordered in UC Medications - No data to display  Initial Impression / Assessment and Plan / UC Course  I have reviewed the triage vital signs and the nursing notes.  Pertinent labs & imaging results that were available during my care of the patient were reviewed by me and considered in my medical decision making (see chart for details).     X-rays were reviewed with patient. Final Clinical Impressions(s) / UC Diagnoses   Final diagnoses:  Right cervical radiculopathy  Numbness and tingling in right hand     Discharge Instructions      Take the Medrol Dosepak as directed.  Take all of day 1 today.  The time inflammatory to reduce nerve pain and irritation Take the Flexeril at bedtime.  This is a muscle  relaxer.  It will help relax your muscles to sleep.  Take an hour before bed May use ice or heat to painful neck muscles Try to limit heavy use of  arm while painful See your doctor if not improving by next week     ED Prescriptions     Medication Sig Dispense Auth. Provider   methylPREDNISolone (MEDROL DOSEPAK) 4 MG TBPK tablet tad 21 tablet Eustace Moore, MD   cyclobenzaprine (FLEXERIL) 10 MG tablet Take 1 tablet (10 mg total) by mouth at bedtime. 10 tablet Eustace Moore, MD      PDMP not reviewed this encounter.   Eustace Moore, MD 07/25/23 [id number]

## 2023-07-25 NOTE — Telephone Encounter (Signed)
Opened in error

## 2023-07-25 NOTE — ED Triage Notes (Addendum)
Pain to right wrist that radiates up arm x 1 week. Has tried naproxen without relief.

## 2023-07-25 NOTE — Discharge Instructions (Signed)
Take the Medrol Dosepak as directed.  Take all of day 1 today.  The time inflammatory to reduce nerve pain and irritation Take the Flexeril at bedtime.  This is a muscle relaxer.  It will help relax your muscles to sleep.  Take an hour before bed May use ice or heat to painful neck muscles Try to limit heavy use of arm while painful See your doctor if not improving by next week

## 2023-08-15 IMAGING — DX DG KNEE COMPLETE 4+V*L*
4 series · 4 of 4 positions shown · non-contrast
Comparison: None.

CLINICAL DATA: Sudden onset LEFT knee pain

EXAM:
LEFT KNEE - COMPLETE 4+ VIEW

[knee ap]
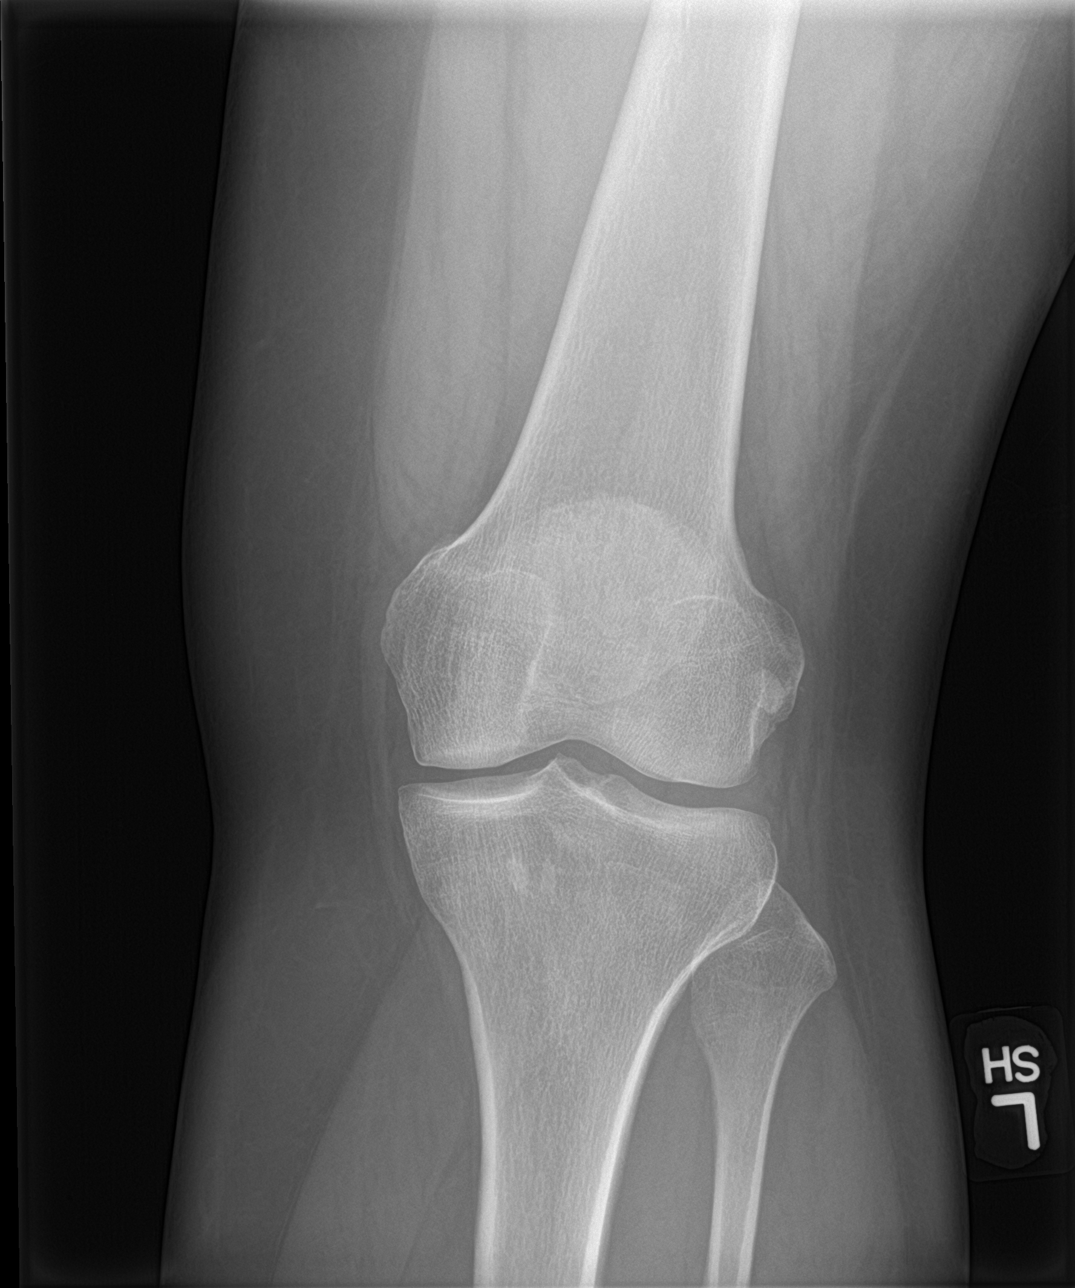

[knee lat]
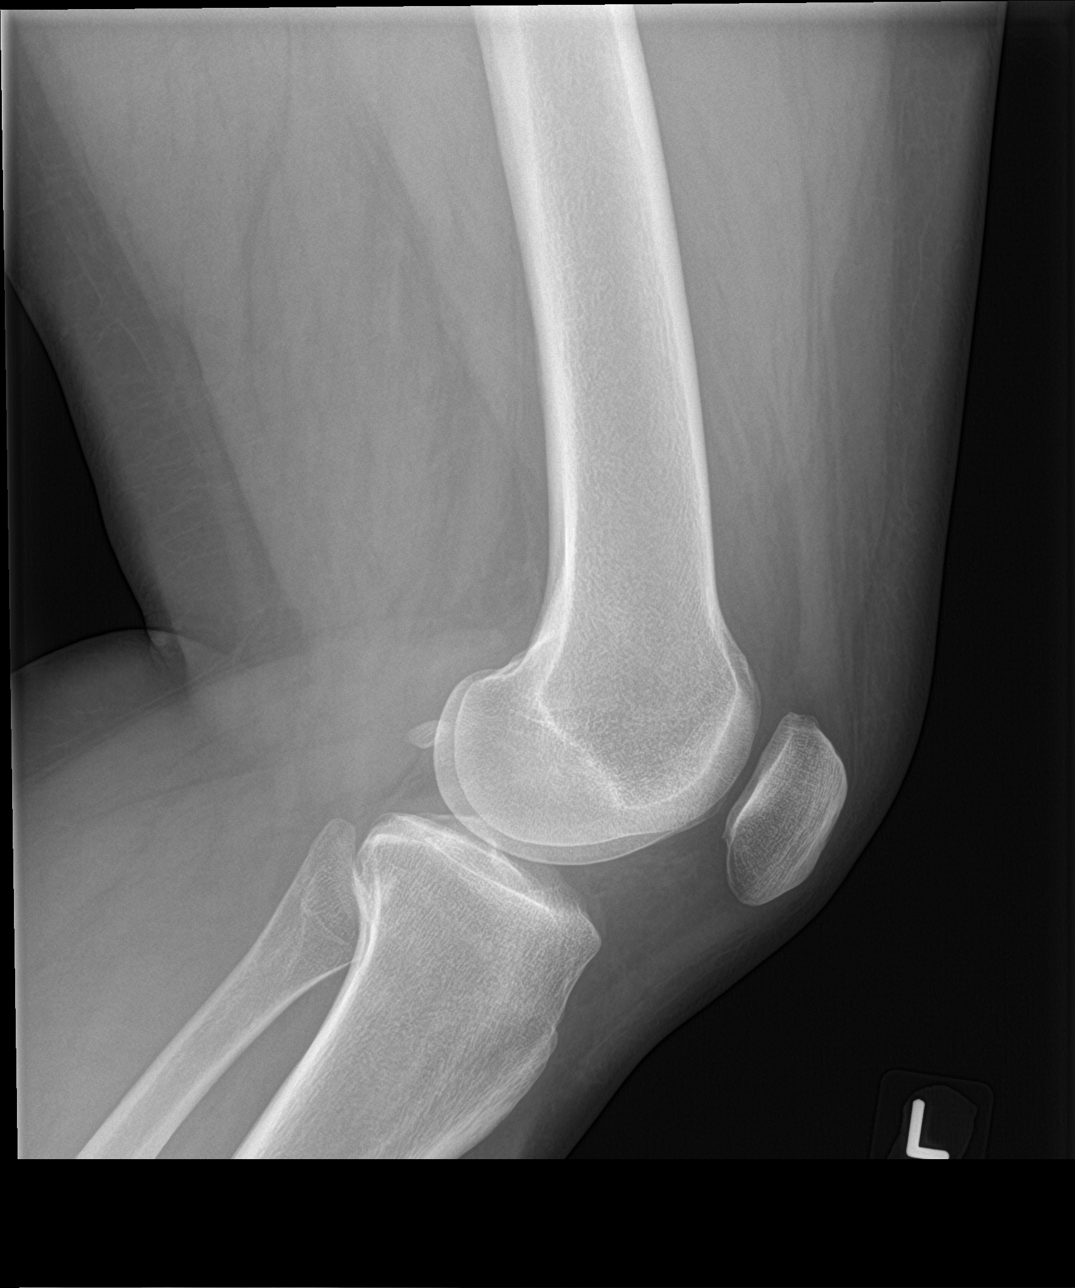

[knee obl (1 of 2)]
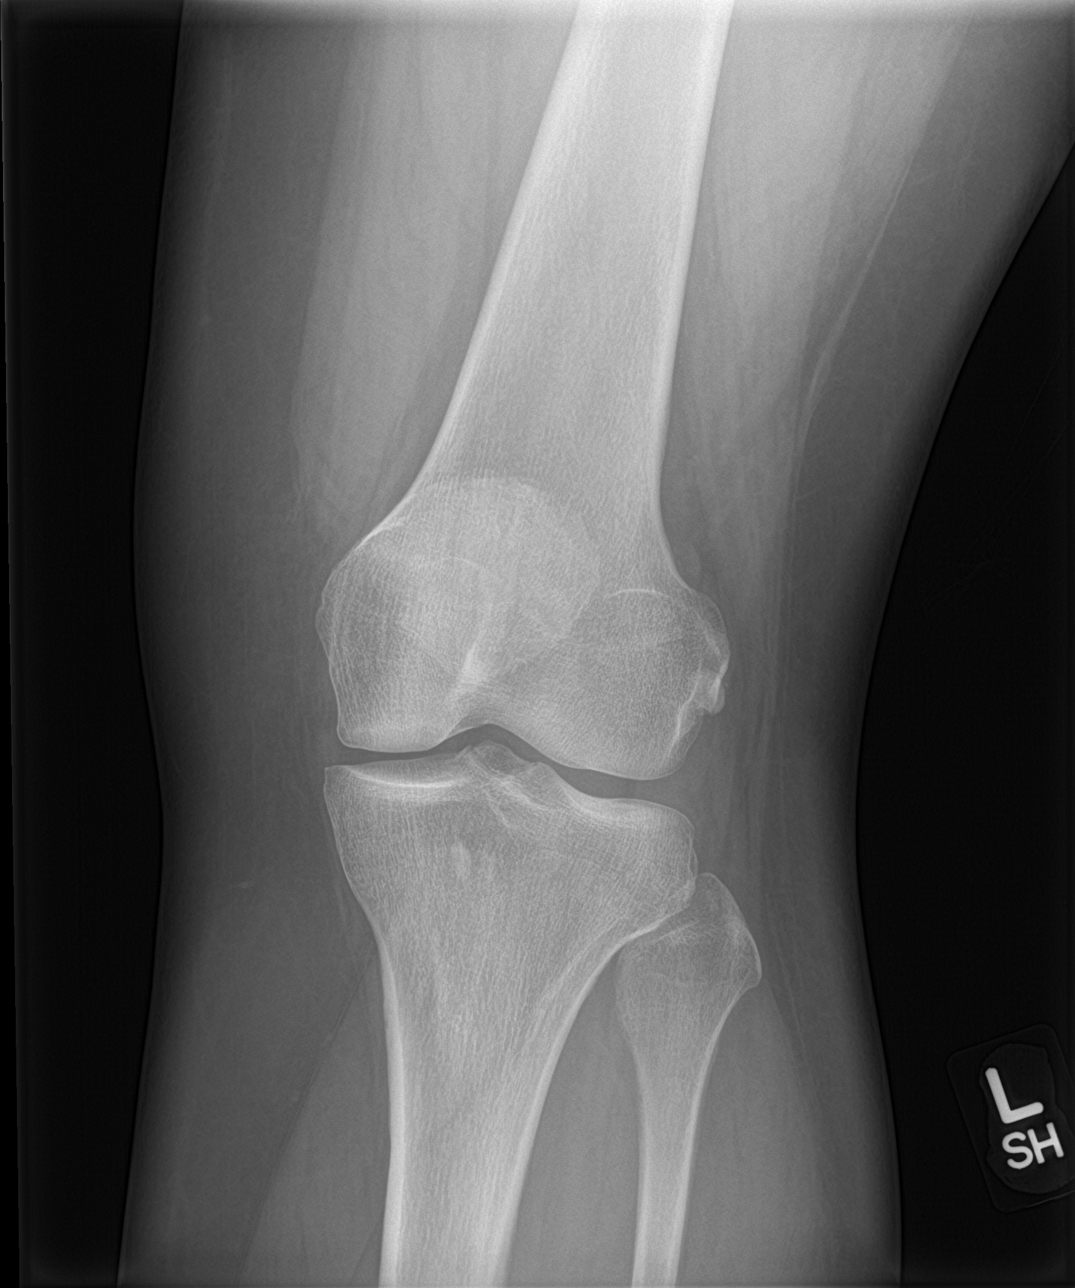

[knee obl (2 of 2)]
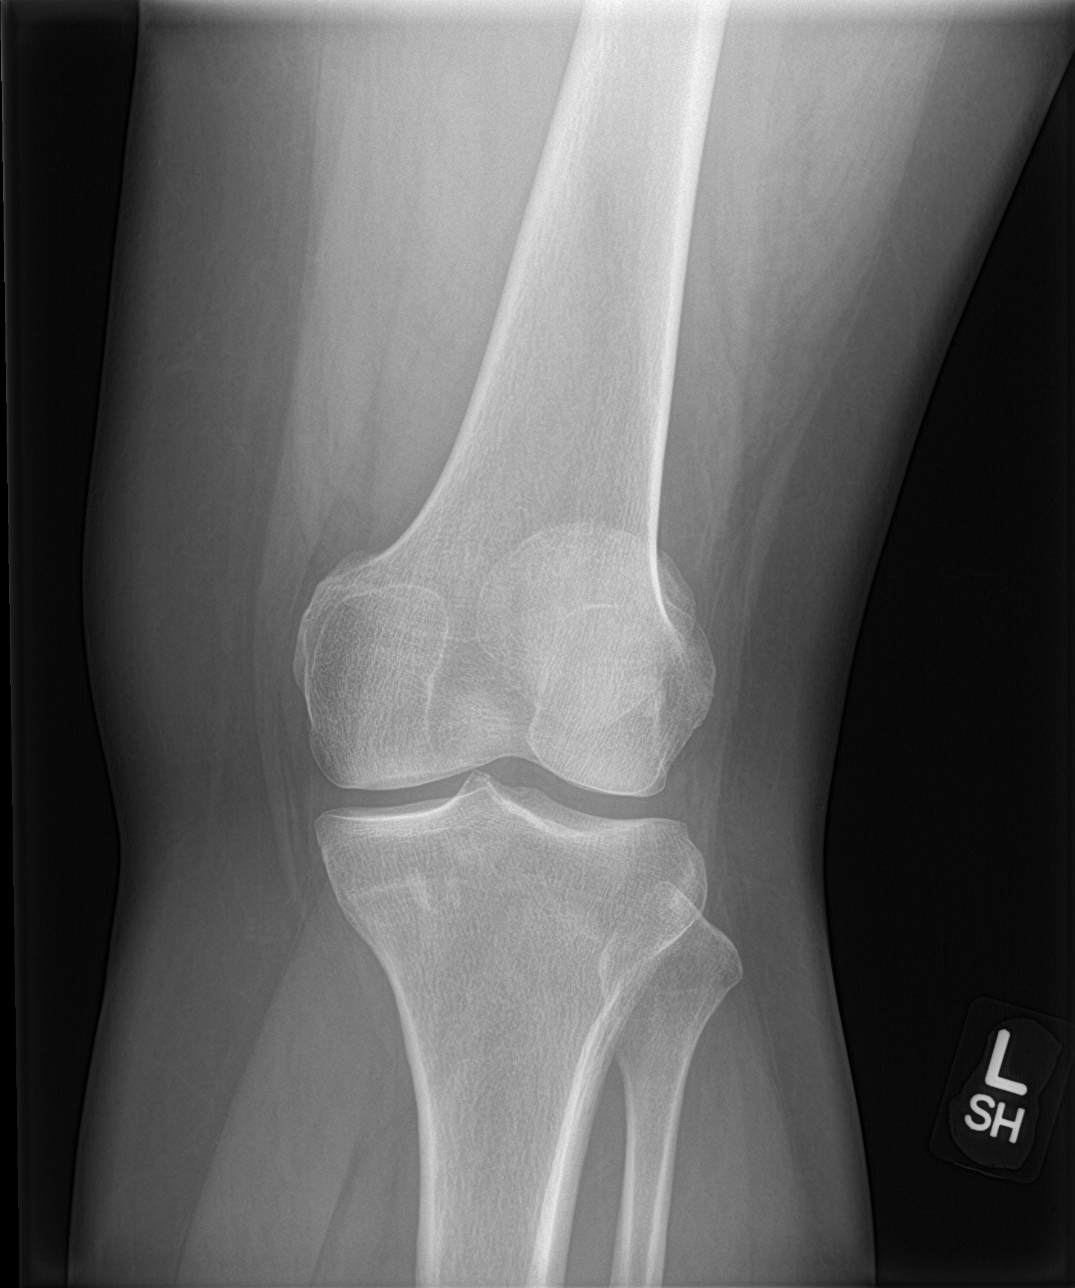

[4 of 4 positions shown; findings below may reference images not displayed]

FINDINGS: No fracture of the proximal tibia or distal femur. Patella is
normal. No joint effusion.
IMPRESSION: Normal knee radiograph
